# Patient Record
Sex: Male | Born: 1995 | Hispanic: Yes | Marital: Single | Smoking: Never smoker
Health system: Southern US, Community
[De-identification: ages and names within clinical notes are randomized; demographics above are authoritative.]

---
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
## 2018-07-20 ENCOUNTER — Other Ambulatory Visit: Payer: Self-pay

## 2018-07-20 ENCOUNTER — Ambulatory Visit
Admission: EM | Admit: 2018-07-20 | Discharge: 2018-07-20 | Disposition: A | Discharge status: Home / Self Care | Payer: Self-pay | Attending: Emergency Medicine | Admitting: Emergency Medicine

## 2018-07-20 ENCOUNTER — Encounter: Payer: Self-pay | Admitting: Emergency Medicine

## 2018-07-20 ENCOUNTER — Ambulatory Visit (INDEPENDENT_AMBULATORY_CARE_PROVIDER_SITE_OTHER): Payer: Self-pay

## 2018-07-20 DIAGNOSIS — S63601A Unspecified sprain of right thumb, initial encounter: Secondary | ICD-10-CM

## 2018-07-20 DIAGNOSIS — M79644 Pain in right finger(s): Secondary | ICD-10-CM

## 2018-07-20 MED ORDER — IBUPROFEN 600 MG PO TABS: 600.0000 mg | ORAL_TABLET | Freq: Four times a day (QID) | ORAL | 0 refills | Status: AC | PRN

## 2018-07-20 NOTE — ED Triage Notes (Signed)
Patient c/o right thumb pain that started on Saturday while he was moving a bed.

## 2018-07-20 NOTE — ED Provider Notes (Signed)
HPI  SUBJECTIVE:  Scott Key is a right-handed 22 y.o. male who presents with right thumb pain described as throbbing, sore, intermittent, lasting hours.  Pain is primarily located in the webspace between thumb and index finger, IP joint, proximal phalanx.  States that he hyperextended at the IP joint while at work.  States his thumb went numb for several hours, but this has resolved.  Reports swelling, but this has improved with ice.  He tried a thumb splint, ice.  Symptoms are worse with palpation and with picking up things that weigh more than 10 pounds.  Reports grip weakness with turning handles or carrying heavy things due to pain.  He is not a smoker.  Patient has no past medical history.  States that this is not a workers comp case.  History reviewed. No pertinent past medical history.  History reviewed. No pertinent surgical history.  History reviewed. No pertinent family history.  Social History   Tobacco Use  . Smoking status: Never Smoker  . Smokeless tobacco: Never Used  Substance Use Topics  . Alcohol use: Never    Frequency: Never  . Drug use: Never    No current facility-administered medications for this encounter.   Current Outpatient Medications:  .  ibuprofen (ADVIL,MOTRIN) 600 MG tablet, Take 1 tablet (600 mg total) by mouth every 6 (six) hours as needed., Disp: 30 tablet, Rfl: 0  No Known Allergies   ROS  As noted in HPI.   Physical Exam  BP 137/70 (BP Location: Left Arm)   Pulse 73   Temp 98.5 F (36.9 C) (Oral)   Resp 18   Ht 5\' 6"  (1.676 m)   Wt 81.6 kg   SpO2 99%   BMI 29.05 kg/m   Constitutional: Well developed, well nourished, no acute distress Eyes:  EOMI, conjunctiva normal bilaterally HENT: Normocephalic, atraumatic,mucus membranes moist Respiratory: Normal inspiratory effort Cardiovascular: Normal rate GI: nondistended skin: No rash, skin intact Musculoskeletal: Right thumb: Normal appearance.  No deformity, swelling, bruising.   Tenderness at the webspace between the thumb and index finger.  Tenderness at the [facility] joint, proximal phalanx.  No tenderness at the IP joint.  IP, CMC joint stable on varus/valgus stress.  Patient able to flex/extend at the IP, [facility] joint.  No limitation of motion.  Patient able to oppose thumb.  Sensation to light touch and temperature intact.  Cap refill less than 2 seconds.  No tenderness at the metacarpal joint.  No other carpal tenderness.  Wrist nontender. Neurologic: Alert & oriented x 3, no focal neuro deficits Psychiatric: Speech and behavior appropriate   ED Course   Medications - No data to display  Orders Placed This Encounter  Procedures  . DG Finger Thumb Right    Standing Status:   Standing    Number of Occurrences:   1    Order Specific Question:   Reason for Exam (SYMPTOM  OR DIAGNOSIS REQUIRED)    Answer:   r/o fx. tender at prox phalanx.  . Thumb spica    Standing Status:   Standing    Number of Occurrences:   1    Order Specific Question:   Laterality    Answer:   Right    No results found for this or any previous visit (from the past 24 hour(s)). Dg Finger Thumb Right  Result Date: 07/20/2018 CLINICAL DATA:  Thumb injury with pain. EXAM: RIGHT THUMB 2+V COMPARISON:  07/29/2008. FINDINGS: There is no evidence of fracture or  dislocation. There is no evidence of arthropathy or other focal bone abnormality. Soft tissues are unremarkable IMPRESSION: Negative. Electronically Signed   By: Kennith Center M.D.   On: 07/20/2018 15:49    ED Clinical Impression  Sprain of right thumb, unspecified site of finger, initial encounter   ED Assessment/Plan  We will x-ray thumb to rule out any chip fractures however suspect that this will be normal.  doubt complete tendon rupture, or gamekeeper's thumb.  If normal, will place in a thumb spica splint, sent home with ibuprofen 600 mg take with 1 g of Tylenol together 3 or 4 times a day as needed for pain, follow-up with Dr. Stephenie Acres,  hand surgeon, if not better in 10 days.  Reviewed imaging independently.  Normal thumb.  See radiology report for full details.  Plan as above.  Discussed imaging, MDM, treatment plan, and plan for follow-up with patient. . patient agrees with plan.   Meds ordered this encounter  Medications  . ibuprofen (ADVIL,MOTRIN) 600 MG tablet    Sig: Take 1 tablet (600 mg total) by mouth every 6 (six) hours as needed.    Dispense:  30 tablet    Refill:  0    *This clinic note was created using Scientist, clinical (histocompatibility and immunogenetics). Therefore, there may be occasional mistakes despite careful proofreading.   ?   Domenick Gong, MD 07/21/18 [id number]

## 2018-07-20 NOTE — Discharge Instructions (Addendum)
ibuprofen 600 mg take with 1 g of Tylenol together 3 or 4 times a day as needed for pain, wear the thumb splint as needed for pain, follow-up with Dr. Stephenie Acres, hand surgeon, if not better in 10 days

## 2019-09-13 ENCOUNTER — Other Ambulatory Visit: Payer: Self-pay | Admitting: *Deleted

## 2019-09-13 DIAGNOSIS — Z20822 Contact with and (suspected) exposure to covid-19: Secondary | ICD-10-CM

## 2019-09-14 LAB — NOVEL CORONAVIRUS, NAA: SARS-CoV-2, NAA: NOT DETECTED

## 2019-11-20 IMAGING — CR DG FINGER THUMB 2+V*R*
3 series · 3 of 3 positions shown · non-contrast
Comparison: 07/29/2008.

CLINICAL DATA: Thumb injury with pain.

EXAM:
RIGHT THUMB 2+V

[finger ap]
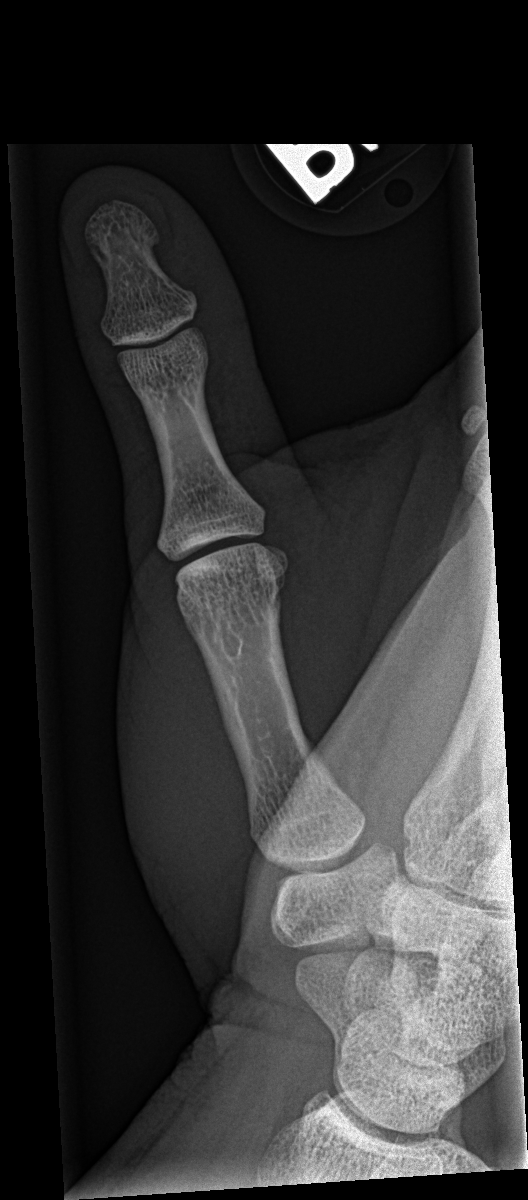

[finger obl]
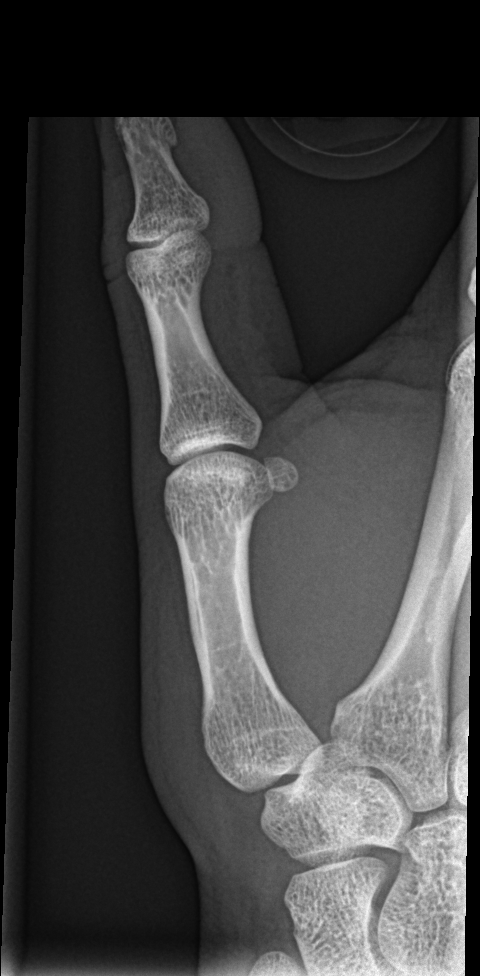

[finger lat]
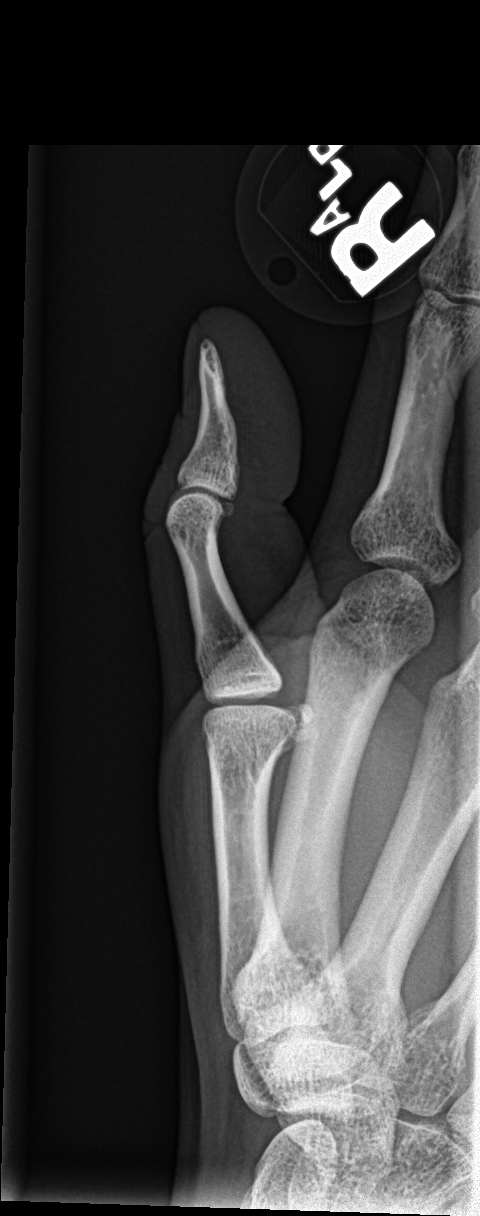

[3 of 3 positions shown; findings below may reference images not displayed]

FINDINGS: There is no evidence of fracture or dislocation. There is no
evidence of arthropathy or other focal bone abnormality. Soft
tissues are unremarkable
IMPRESSION: Negative.

## 2023-01-27 ENCOUNTER — Emergency Department (HOSPITAL_COMMUNITY): Payer: BC Managed Care – PPO

## 2023-01-27 ENCOUNTER — Emergency Department (HOSPITAL_COMMUNITY)
Admission: EM | Admit: 2023-01-27 | Discharge: 2023-01-27 | Disposition: A | Discharge status: Home / Self Care | Payer: BC Managed Care – PPO | Attending: Student | Admitting: Student

## 2023-01-27 DIAGNOSIS — R0789 Other chest pain: Secondary | ICD-10-CM | POA: Insufficient documentation

## 2023-01-27 DIAGNOSIS — M79645 Pain in left finger(s): Secondary | ICD-10-CM | POA: Diagnosis not present

## 2023-01-27 DIAGNOSIS — Y92410 Unspecified street and highway as the place of occurrence of the external cause: Secondary | ICD-10-CM | POA: Insufficient documentation

## 2023-01-27 DIAGNOSIS — R200 Anesthesia of skin: Secondary | ICD-10-CM | POA: Insufficient documentation

## 2023-01-27 MED ORDER — IBUPROFEN 800 MG PO TABS
800.0000 mg | ORAL_TABLET | Freq: Once | ORAL | Status: AC
  Administered 2023-01-27: 800 mg via ORAL
  Filled 2023-01-27: qty 1
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
## 2023-01-27 NOTE — Discharge Instructions (Signed)
Your EKD, hand xray and chest x-ray were negative for any acute fractures or abnormalities.  However, if you experience any severe chest pain, shortness of breath, fainting, inability to move your thumb or hand please return to the emergency department immediately  You should take an milligrams of Motrin every 6 hours and Tylenol 1000 mg 3 times a day for pain.Scott Key
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
